# Patient Record
Sex: Male | Born: 1970
Health system: Southern US, Community
[De-identification: ages and names within clinical notes are randomized; demographics above are authoritative.]

---

## 2009-10-24 ENCOUNTER — Emergency Department: Payer: Self-pay | Admitting: Unknown Physician Specialty

## 2012-01-06 ENCOUNTER — Ambulatory Visit: Payer: Self-pay

## 2012-01-30 ENCOUNTER — Ambulatory Visit: Payer: Self-pay

## 2016-04-21 DIAGNOSIS — E1165 Type 2 diabetes mellitus with hyperglycemia: Secondary | ICD-10-CM | POA: Diagnosis not present

## 2016-04-21 DIAGNOSIS — E349 Endocrine disorder, unspecified: Secondary | ICD-10-CM | POA: Diagnosis not present

## 2016-04-22 DIAGNOSIS — E1122 Type 2 diabetes mellitus with diabetic chronic kidney disease: Secondary | ICD-10-CM | POA: Diagnosis not present

## 2016-04-22 DIAGNOSIS — E1165 Type 2 diabetes mellitus with hyperglycemia: Secondary | ICD-10-CM | POA: Diagnosis not present

## 2016-04-22 DIAGNOSIS — N183 Chronic kidney disease, stage 3 (moderate): Secondary | ICD-10-CM | POA: Diagnosis not present

## 2016-05-07 DIAGNOSIS — E349 Endocrine disorder, unspecified: Secondary | ICD-10-CM | POA: Diagnosis not present

## 2016-05-30 DIAGNOSIS — E291 Testicular hypofunction: Secondary | ICD-10-CM | POA: Diagnosis not present

## 2016-06-13 DIAGNOSIS — E349 Endocrine disorder, unspecified: Secondary | ICD-10-CM | POA: Diagnosis not present

## 2016-07-22 DIAGNOSIS — E1122 Type 2 diabetes mellitus with diabetic chronic kidney disease: Secondary | ICD-10-CM | POA: Diagnosis not present

## 2016-07-22 DIAGNOSIS — N183 Chronic kidney disease, stage 3 (moderate): Secondary | ICD-10-CM | POA: Diagnosis not present

## 2016-07-22 DIAGNOSIS — I10 Essential (primary) hypertension: Secondary | ICD-10-CM | POA: Diagnosis not present

## 2016-07-22 DIAGNOSIS — E1129 Type 2 diabetes mellitus with other diabetic kidney complication: Secondary | ICD-10-CM | POA: Diagnosis not present

## 2016-11-19 DIAGNOSIS — R7989 Other specified abnormal findings of blood chemistry: Secondary | ICD-10-CM | POA: Diagnosis not present

## 2016-11-19 DIAGNOSIS — R5381 Other malaise: Secondary | ICD-10-CM | POA: Diagnosis not present

## 2016-11-19 DIAGNOSIS — I10 Essential (primary) hypertension: Secondary | ICD-10-CM | POA: Diagnosis not present

## 2016-11-28 DIAGNOSIS — R7989 Other specified abnormal findings of blood chemistry: Secondary | ICD-10-CM | POA: Diagnosis not present

## 2016-12-09 DIAGNOSIS — M26629 Arthralgia of temporomandibular joint, unspecified side: Secondary | ICD-10-CM | POA: Diagnosis not present

## 2016-12-09 DIAGNOSIS — H93291 Other abnormal auditory perceptions, right ear: Secondary | ICD-10-CM | POA: Diagnosis not present

## 2017-01-14 DIAGNOSIS — Z23 Encounter for immunization: Secondary | ICD-10-CM | POA: Diagnosis not present

## 2017-03-18 DIAGNOSIS — E1129 Type 2 diabetes mellitus with other diabetic kidney complication: Secondary | ICD-10-CM | POA: Diagnosis not present

## 2017-03-18 DIAGNOSIS — N183 Chronic kidney disease, stage 3 (moderate): Secondary | ICD-10-CM | POA: Diagnosis not present

## 2017-03-18 DIAGNOSIS — E1122 Type 2 diabetes mellitus with diabetic chronic kidney disease: Secondary | ICD-10-CM | POA: Diagnosis not present

## 2017-12-02 DIAGNOSIS — E1129 Type 2 diabetes mellitus with other diabetic kidney complication: Secondary | ICD-10-CM | POA: Diagnosis not present

## 2017-12-02 DIAGNOSIS — N183 Chronic kidney disease, stage 3 (moderate): Secondary | ICD-10-CM | POA: Diagnosis not present

## 2017-12-02 DIAGNOSIS — E1142 Type 2 diabetes mellitus with diabetic polyneuropathy: Secondary | ICD-10-CM | POA: Diagnosis not present

## 2017-12-02 DIAGNOSIS — E1122 Type 2 diabetes mellitus with diabetic chronic kidney disease: Secondary | ICD-10-CM | POA: Diagnosis not present

## 2018-01-28 DIAGNOSIS — E119 Type 2 diabetes mellitus without complications: Secondary | ICD-10-CM | POA: Diagnosis not present

## 2018-01-28 DIAGNOSIS — R7989 Other specified abnormal findings of blood chemistry: Secondary | ICD-10-CM | POA: Diagnosis not present

## 2018-01-28 DIAGNOSIS — R5381 Other malaise: Secondary | ICD-10-CM | POA: Diagnosis not present

## 2018-03-26 DIAGNOSIS — Z79899 Other long term (current) drug therapy: Secondary | ICD-10-CM | POA: Diagnosis not present

## 2018-03-26 DIAGNOSIS — E291 Testicular hypofunction: Secondary | ICD-10-CM | POA: Diagnosis not present

## 2018-03-26 DIAGNOSIS — N401 Enlarged prostate with lower urinary tract symptoms: Secondary | ICD-10-CM | POA: Diagnosis not present

## 2018-06-08 DIAGNOSIS — E291 Testicular hypofunction: Secondary | ICD-10-CM | POA: Diagnosis not present

## 2018-07-12 DIAGNOSIS — E1142 Type 2 diabetes mellitus with diabetic polyneuropathy: Secondary | ICD-10-CM | POA: Diagnosis not present

## 2018-07-12 DIAGNOSIS — E1122 Type 2 diabetes mellitus with diabetic chronic kidney disease: Secondary | ICD-10-CM | POA: Diagnosis not present

## 2018-07-12 DIAGNOSIS — E1129 Type 2 diabetes mellitus with other diabetic kidney complication: Secondary | ICD-10-CM | POA: Diagnosis not present

## 2018-08-05 DIAGNOSIS — N183 Chronic kidney disease, stage 3 (moderate): Secondary | ICD-10-CM | POA: Diagnosis not present

## 2018-08-05 DIAGNOSIS — I10 Essential (primary) hypertension: Secondary | ICD-10-CM | POA: Diagnosis not present

## 2018-11-19 ENCOUNTER — Other Ambulatory Visit: Payer: Self-pay | Admitting: Nephrology

## 2018-11-19 DIAGNOSIS — N183 Chronic kidney disease, stage 3 unspecified: Secondary | ICD-10-CM

## 2018-11-30 ENCOUNTER — Other Ambulatory Visit: Payer: Self-pay

## 2018-11-30 ENCOUNTER — Ambulatory Visit
Admission: RE | Admit: 2018-11-30 | Discharge: 2018-11-30 | Disposition: A | Discharge status: Home / Self Care | Payer: No Typology Code available for payment source | Source: Ambulatory Visit | Attending: Nephrology | Admitting: Nephrology

## 2018-11-30 DIAGNOSIS — N183 Chronic kidney disease, stage 3 unspecified: Secondary | ICD-10-CM

## 2019-11-18 ENCOUNTER — Other Ambulatory Visit: Payer: Self-pay

## 2019-11-18 ENCOUNTER — Other Ambulatory Visit: Payer: Self-pay | Admitting: Nephrology

## 2019-11-18 ENCOUNTER — Ambulatory Visit
Admission: RE | Admit: 2019-11-18 | Discharge: 2019-11-18 | Disposition: A | Discharge status: Home / Self Care | Payer: No Typology Code available for payment source | Source: Ambulatory Visit | Attending: Nephrology | Admitting: Nephrology

## 2019-11-18 DIAGNOSIS — N200 Calculus of kidney: Secondary | ICD-10-CM | POA: Diagnosis present

## 2021-05-06 IMAGING — US US RENAL
1 series · 14 of 25 positions shown · non-contrast
Comparison: None.

CLINICAL DATA: Chronic kidney disease stage 3.

EXAM:
RENAL / URINARY TRACT ULTRASOUND COMPLETE

[Series 1: us renal · 0.26mm/px · 14 of 33 slices shown]
[im 1/33]
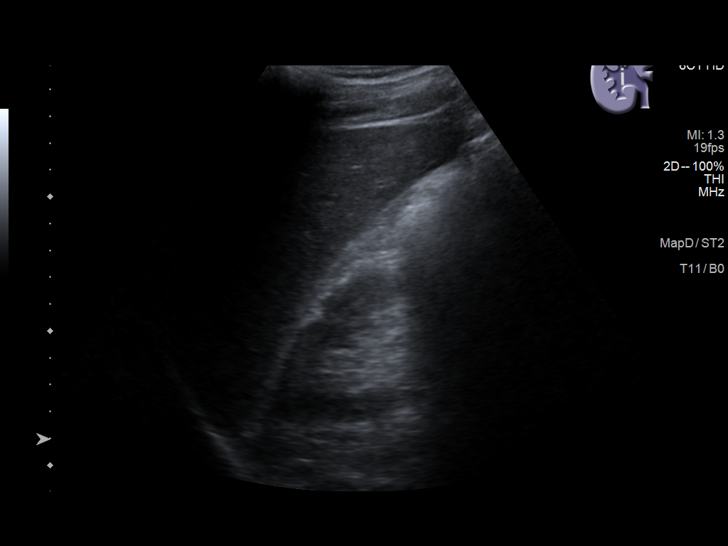
[im 3/33]
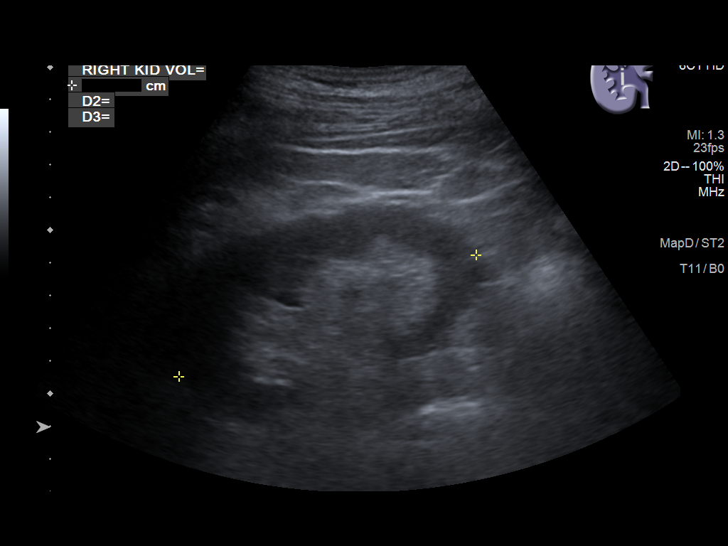
[im 6/33]
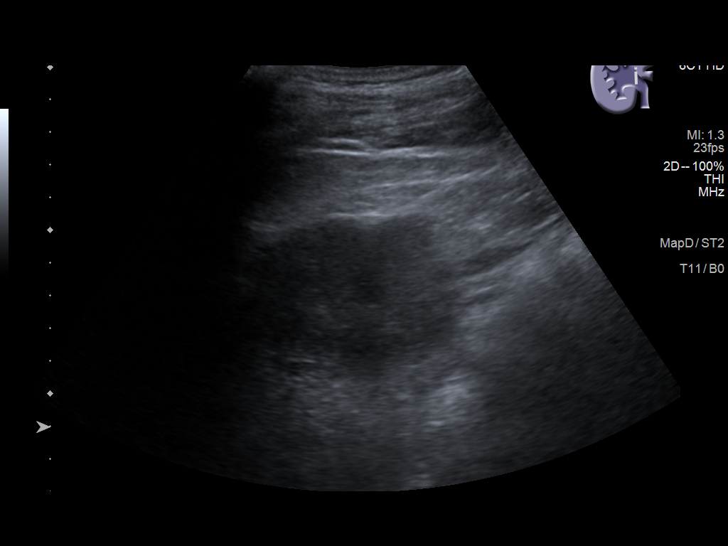
[im 9/33]
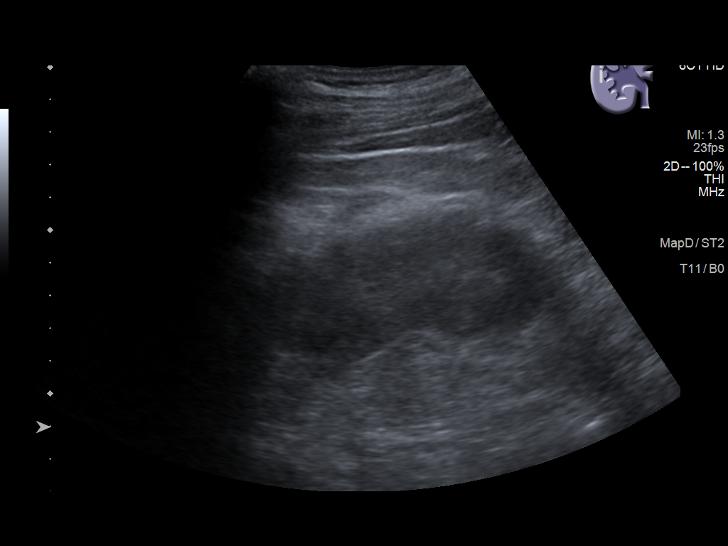
[im 11/33]
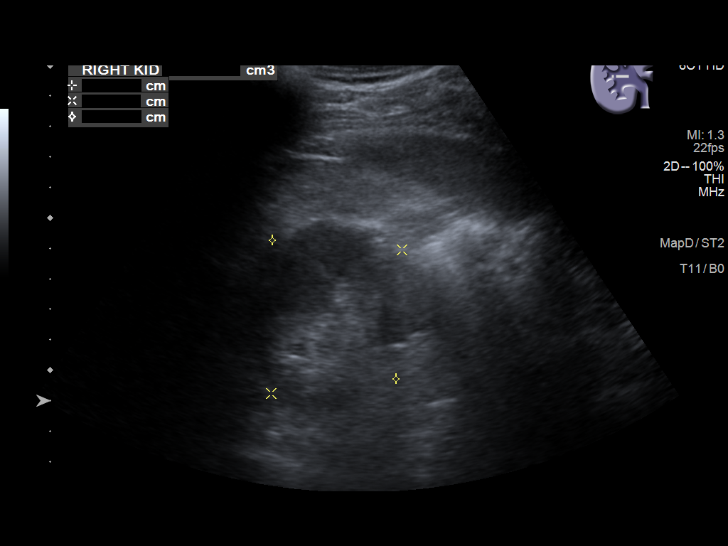
[im 13/33]
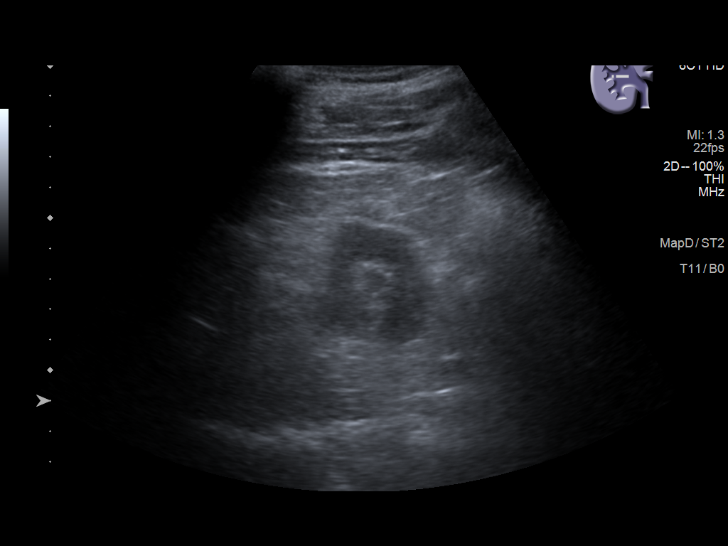
[im 15/33]
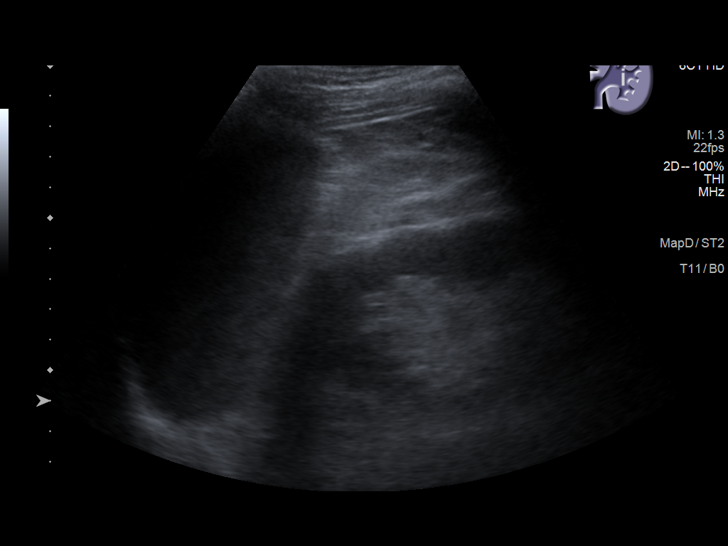
[im 18/33]
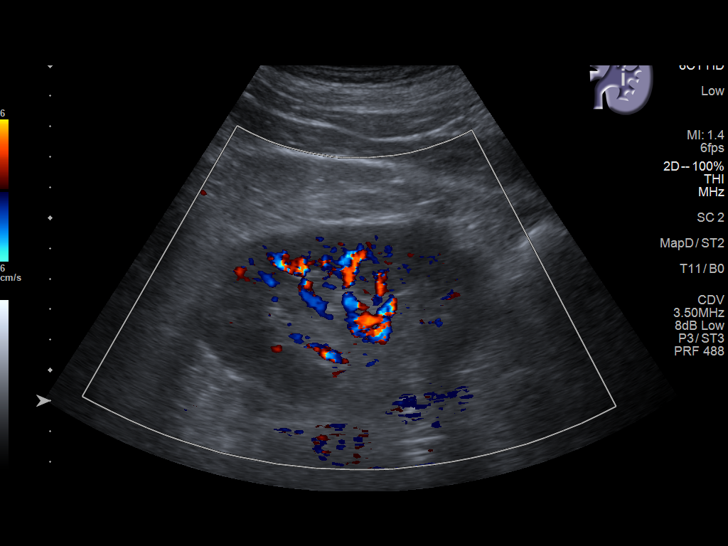
[im 21/33]
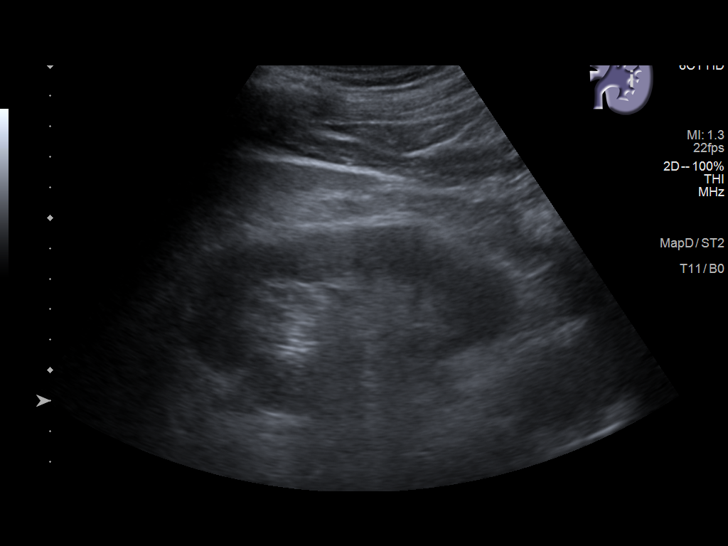
[im 22/33]
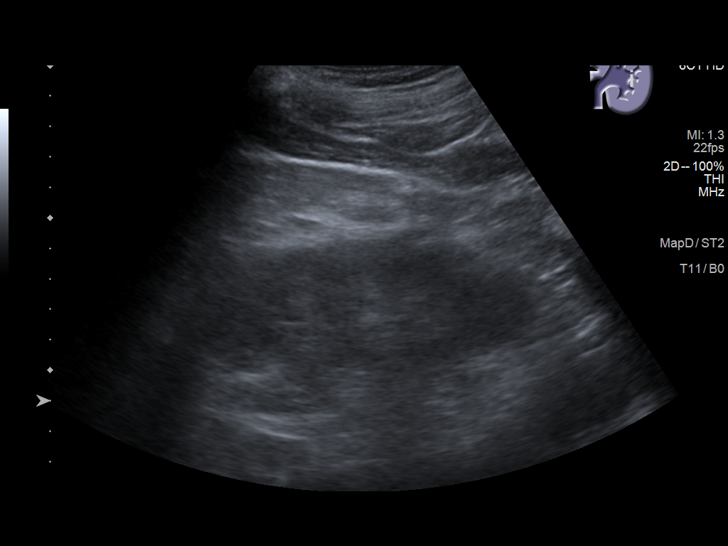
[im 25/33]
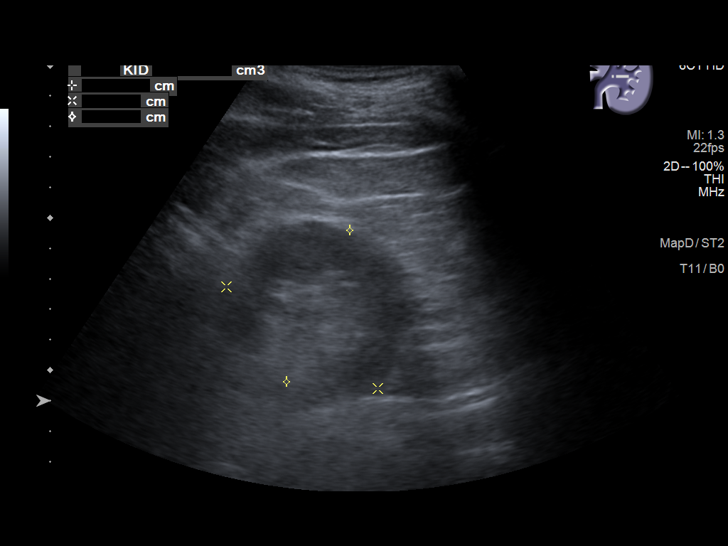
[im 27/33]
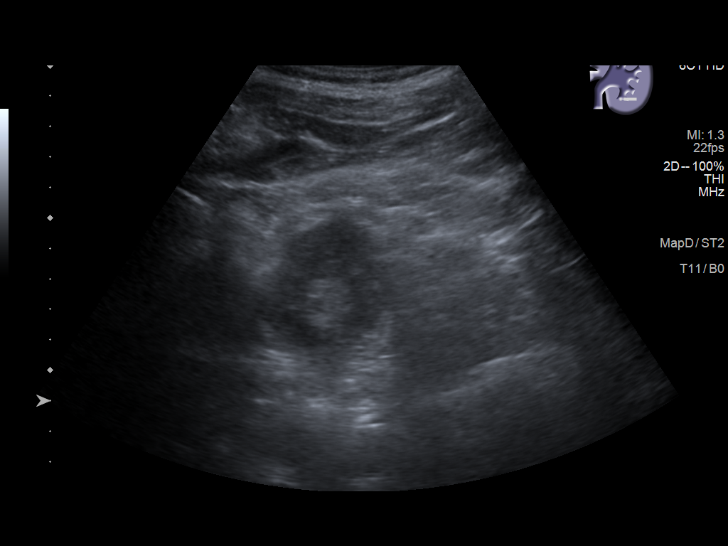
[im 30/33]
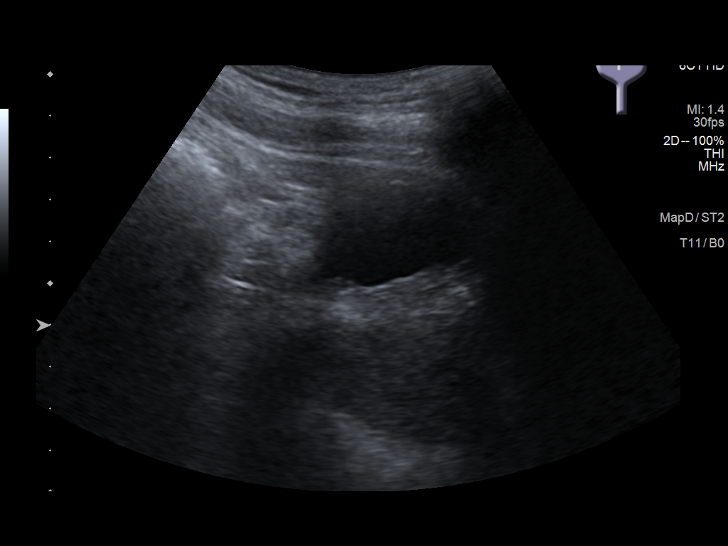
[im 33/33]
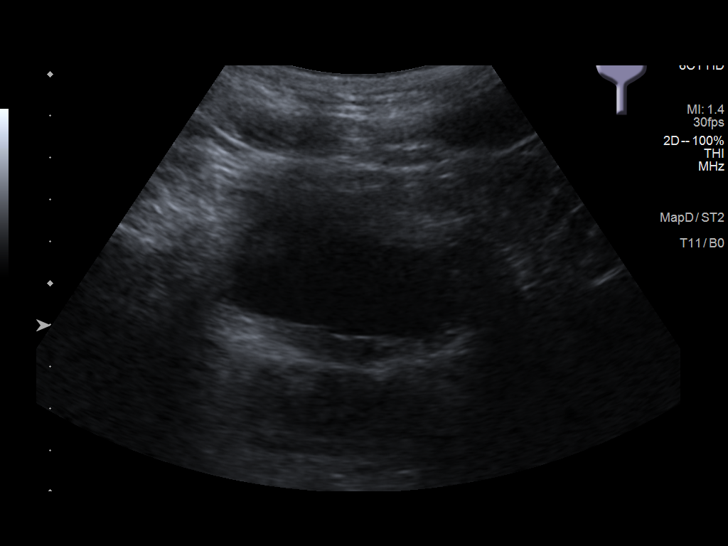

[14 of 25 positions shown; findings below may reference images not displayed]

FINDINGS: Right Kidney:

Renal measurements: 9.8 x 6.4 x 6.1 cm = volume: 200 mL .
Echogenicity within normal limits. No mass or hydronephrosis
visualized.

Left Kidney:

Renal measurements: 10.7 x 6.0 x 5.4 cm = volume: 180 mL.
Echogenicity within normal limits. No mass or hydronephrosis
visualized.

Bladder:

Appears normal for degree of bladder distention.
IMPRESSION: Normal renal ultrasound.

## 2022-04-24 IMAGING — CT CT ABD-PELV W/O CM
2 of 4 series · 15 of 46 positions shown, 17 images · non-contrast
Comparison: None.

CLINICAL DATA: Right flank pain and hematuria for 2 days.

EXAM:
CT ABDOMEN AND PELVIS WITHOUT CONTRAST
TECHNIQUE: Multidetector CT imaging of the abdomen and pelvis was performed
following the standard protocol without IV contrast.

[Series 2: stone full standard · axial · 0.72mm/px · z∈[-1169,-669]mm · 12 of 110 slices shown, 14 images]
[im 5/110  soft-tissue]
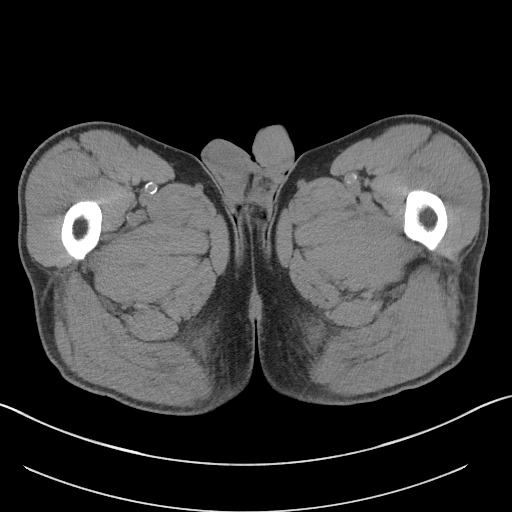
[im 5/110  bone]
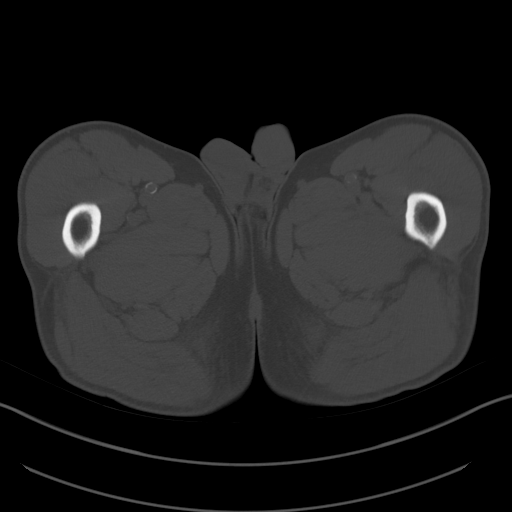
[im 14/110  soft-tissue]
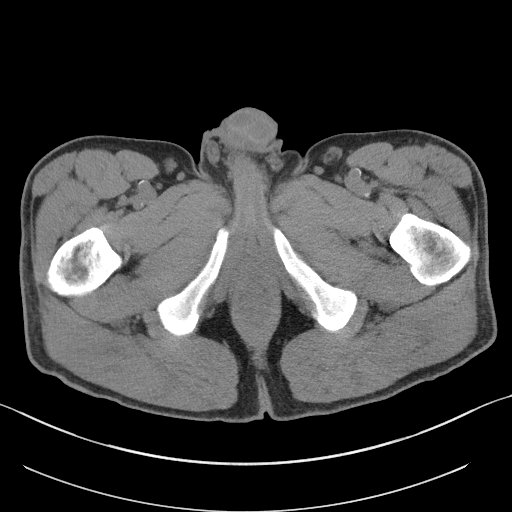
[im 23/110  soft-tissue]
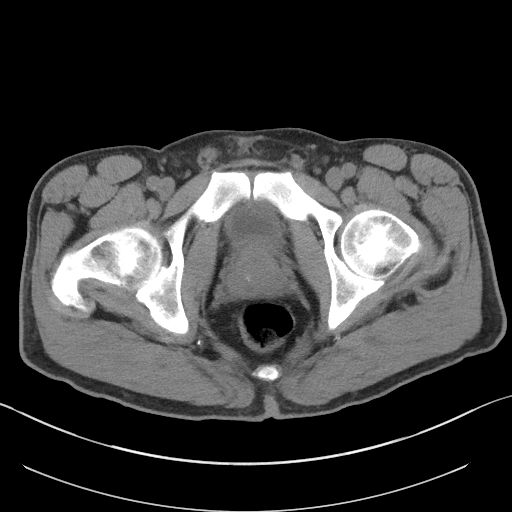
[im 32/110  soft-tissue]
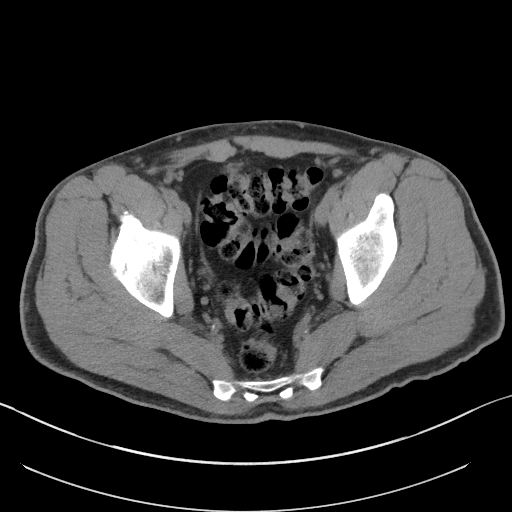
[im 41/110  soft-tissue]
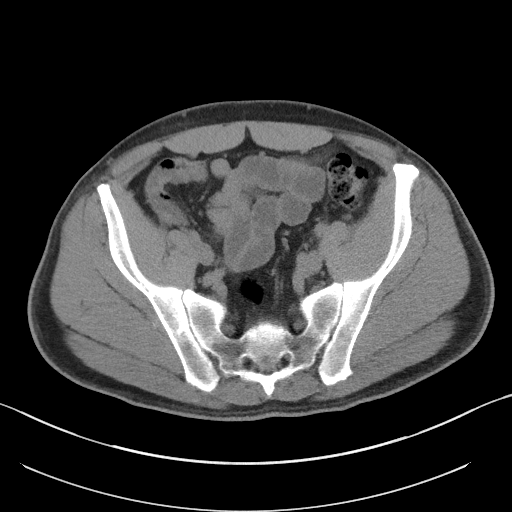
[im 50/110  soft-tissue]
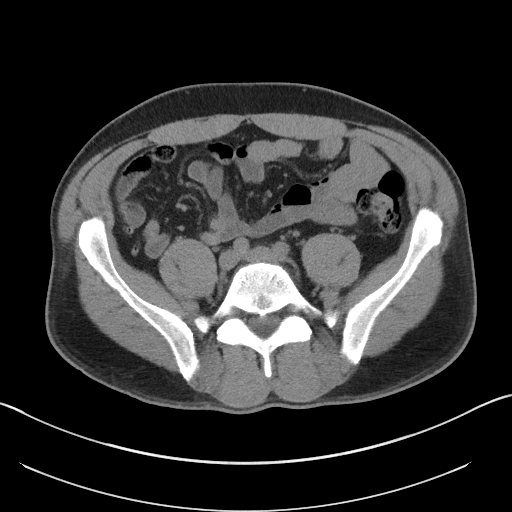
[im 60/110  soft-tissue]
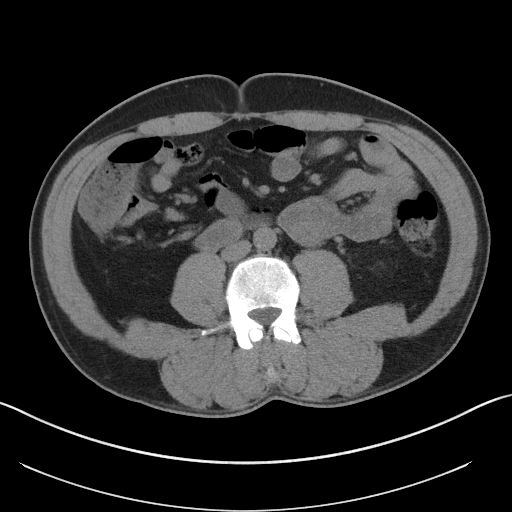
[im 69/110  soft-tissue]
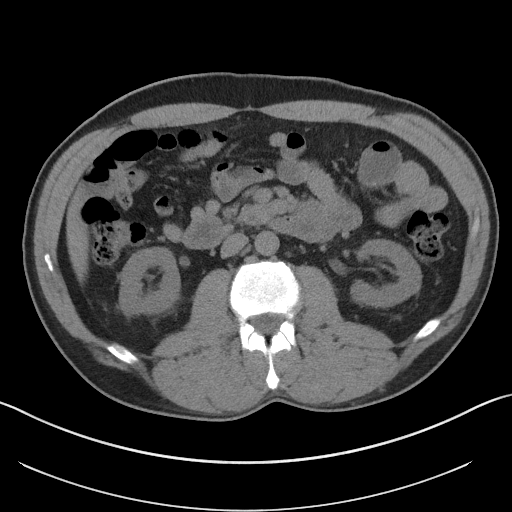
[im 78/110  soft-tissue]
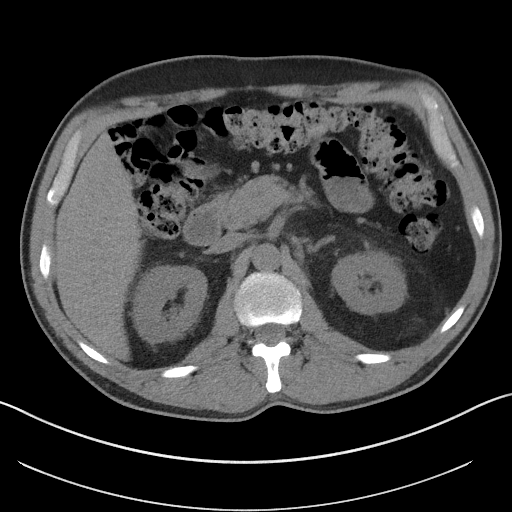
[im 78/110  bone]
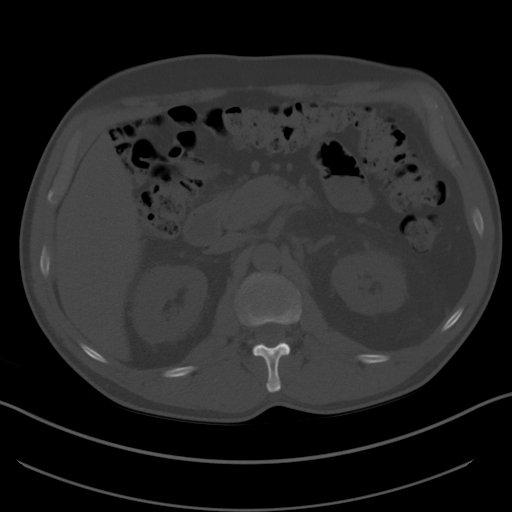
[im 87/110  soft-tissue]
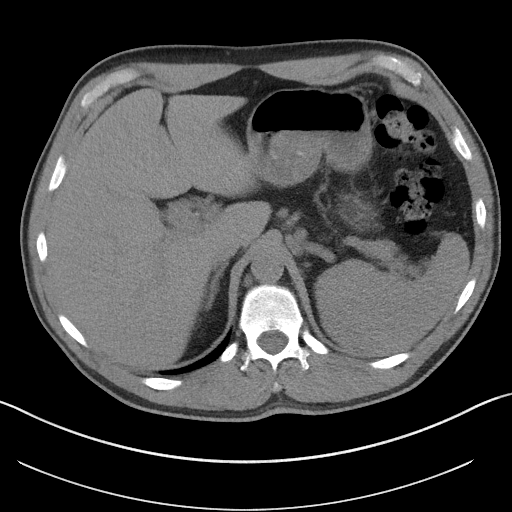
[im 96/110  soft-tissue]
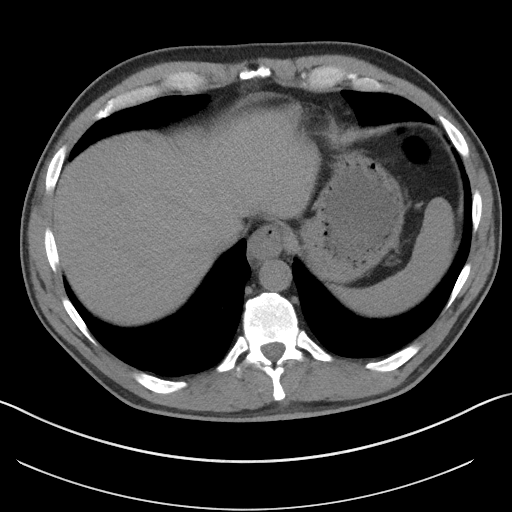
[im 105/110  soft-tissue]
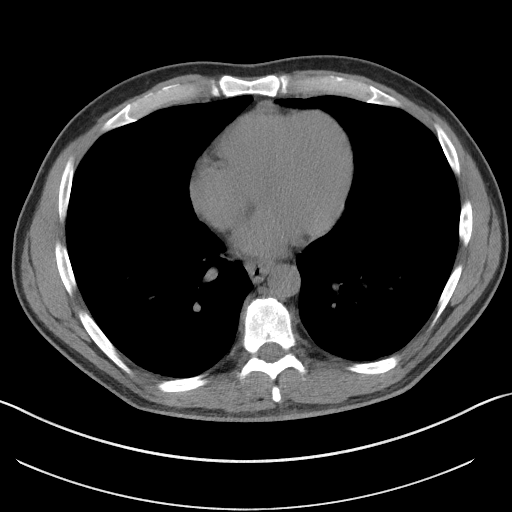

[Series 5: coronal · coronal · 0.67mm/px · 3 of 140 slices shown]
[im 47/140  soft-tissue]
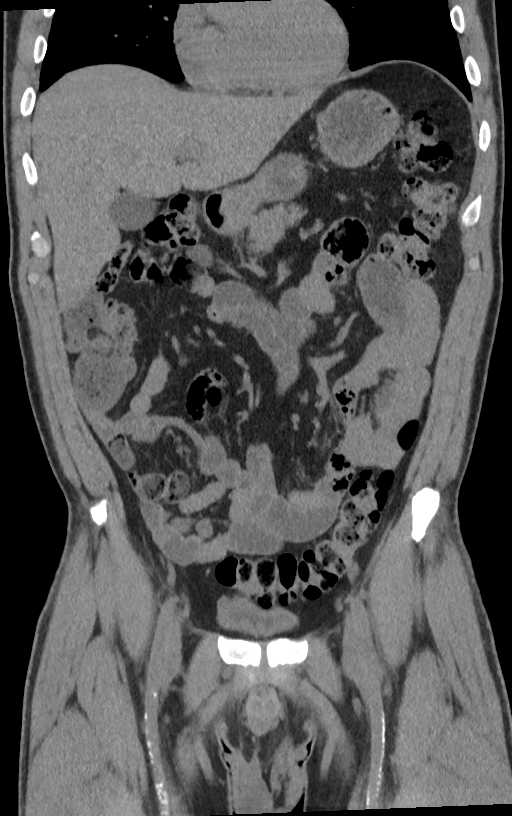
[im 62/140  soft-tissue]
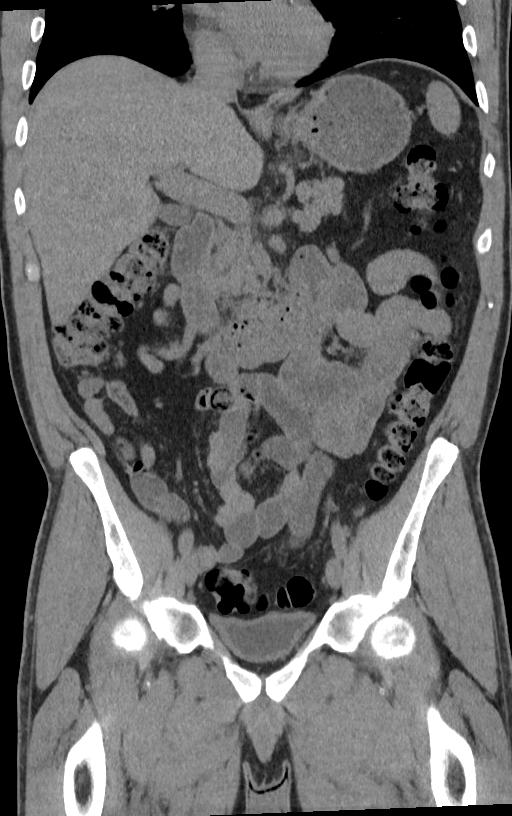
[im 78/140  soft-tissue]
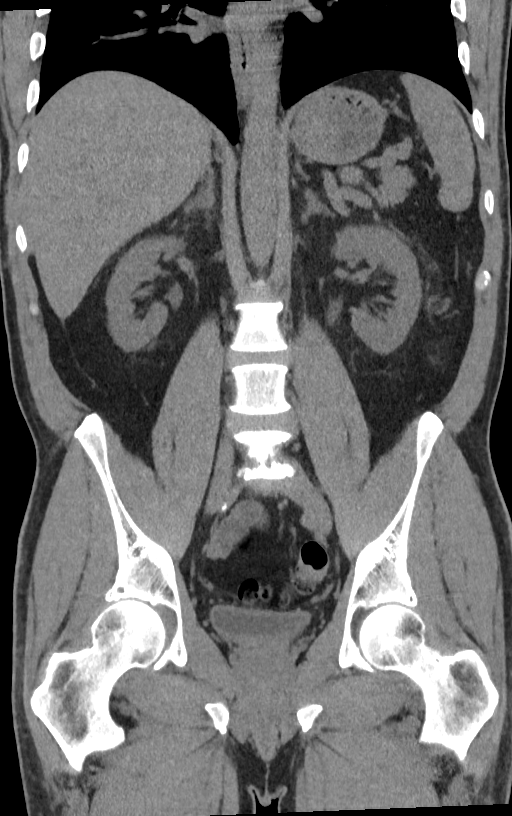

[15 of 46 positions shown; findings below may reference images not displayed]

FINDINGS: Lower chest: The lung bases are clear of acute process. No pleural
effusion or pulmonary lesions. The heart is normal in size. No
pericardial effusion. The distal esophagus and aorta are
unremarkable.

Hepatobiliary: No hepatic lesions are identified without contrast.
No intrahepatic biliary dilatation. The gallbladder appears normal.
No common bile duct dilatation.

Pancreas: No mass, inflammation or ductal dilatation.

Spleen: Normal size.  No focal lesions.

Adrenals/Urinary Tract: The adrenal glands and kidneys are
unremarkable. No renal, ureteral or bladder calculi or mass is
identified without contrast.

Stomach/Bowel: The stomach, duodenum, small bowel and colon are
grossly normal without oral contrast. No inflammatory changes, mass
lesions or obstructive findings. The appendix is normal.

Vascular/Lymphatic: The aorta is normal in caliber. Minimal
scattered atheroscerlotic calcifications. More significant
calcifications are noted in the femoral arteries bilaterally. No
mesenteric of retroperitoneal mass or adenopathy. Small scattered
lymph nodes are noted.

Reproductive: The prostate gland and seminal vesicles are
unremarkable.

Other: No pelvic mass or adenopathy. No free pelvic fluid
collections. No inguinal mass or adenopathy. No abdominal wall
hernia or subcutaneous lesions.

Musculoskeletal: No significant bony findings. Advanced degenerative
disc disease noted at L5-S1.
IMPRESSION: 1. No acute abdominal/pelvic findings, mass lesions or adenopathy.
2. No renal, ureteral or bladder calculi or mass.
3. Age advanced vascular calcifications.

## 2022-06-26 ENCOUNTER — Encounter: Payer: Self-pay | Admitting: Urology

## 2022-06-26 ENCOUNTER — Ambulatory Visit (INDEPENDENT_AMBULATORY_CARE_PROVIDER_SITE_OTHER): Payer: 59 | Admitting: Urology

## 2022-06-26 VITALS — BP 124/85 | HR 92 | Ht 72.0 in | Wt 184.8 lb

## 2022-06-26 DIAGNOSIS — E291 Testicular hypofunction: Secondary | ICD-10-CM

## 2022-06-26 DIAGNOSIS — Z125 Encounter for screening for malignant neoplasm of prostate: Secondary | ICD-10-CM | POA: Diagnosis not present

## 2022-06-26 DIAGNOSIS — N529 Male erectile dysfunction, unspecified: Secondary | ICD-10-CM

## 2022-06-26 DIAGNOSIS — R7989 Other specified abnormal findings of blood chemistry: Secondary | ICD-10-CM

## 2022-06-26 MED ORDER — TADALAFIL 5 MG PO TABS: 5.0000 mg | ORAL_TABLET | Freq: Every day | ORAL | 3 refills | Status: DC

## 2022-06-26 MED ORDER — XYOSTED 75 MG/0.5ML ~~LOC~~ SOAJ: 75.0000 mg | SUBCUTANEOUS | 3 refills | Status: DC

## 2022-06-26 NOTE — Progress Notes (Signed)
   I, DeAsia L Maxie,acting as a scribe for Abbie Sons, MD.,have documented all relevant documentation on the behalf of Abbie Sons, MD,as directed by  Abbie Sons, MD while in the presence of Abbie Sons, MD.   I, Amy L Pierron,acting as a scribe for Abbie Sons, MD.,have documented all relevant documentation on the behalf of Abbie Sons, MD,as directed by  Abbie Sons, MD while in the presence of Abbie Sons, MD.  06/26/2022 4:38 PM   Manson Allan 08-06-1970 [id number]  Referring provider: Maryland Pink, MD [address] [facility] [place],  [place] [address]  Chief Complaint  Patient presents with   Other    testosterone    HPI: Joshua Peck is a 52 y.o. male who presents today to transfer urologic care with the retirement of his recent urologist.  Previously followed by Dr. Yves Dill for hypogonadism and erectile dysfunction and was last seen on 05/15/2021. He is on compounded testosterone cream 20% 1 cc daily. Prior to that was using [place]. He is also on a PDE-5 inhibitor (tadalafil 5 mg) for erectile dysfunction.  He continues on compounded testosterone cream but stated it is inconvenient waiting to get the medication delivered form [place].  He was previously on [place] and is interested in seeing if his insurance will cover.  No bothersome lower urinary tract symptoms. No dysuria or gross hematuria. No flank, abdominal, or pelvic pain.   Home Medications:  Allergies as of 06/26/2022   No Known Allergies      Medication List        Accurate as of June 26, 2022  4:38 PM. If you have any questions, ask your nurse or doctor.          EC-RX Testosterone 0.2 % Crea Place onto the skin.   gabapentin 300 MG capsule Commonly known as: NEURONTIN Take by mouth.   lisinopril-hydrochlorothiazide 20-12.5 MG tablet Commonly known as: ZESTORETIC Take 2 tablets by mouth daily.   omeprazole 40 MG capsule Commonly known as:  PRILOSEC   tadalafil 5 MG tablet Commonly known as: CIALIS Take 1 tablet (5 mg total) by mouth daily.   venlafaxine XR 37.5 MG 24 hr capsule Commonly known as: EFFEXOR-XR Take 37.5 mg by mouth daily.        Social History:  has no history on file for tobacco use, alcohol use, and drug use.  Physical Exam: BP 124/85   Pulse 92   Ht 6' (1.829 m)   Wt 184 lb 12.8 oz (83.8 kg)   BMI 25.06 kg/m   Constitutional:  Alert and oriented, No acute distress. HEENT: [place] AT Respiratory: Normal respiratory effort, no increased work of breathing. Psychiatric: Normal mood and affect.  Assessment & Plan:    Hypogonadism Presently on testosterone cream; testosterone, PSA, and hematocrit were drawn today. Rx for [facility] sent to [employer] [facility] to see if insurance coverage has changed.  If not covered we discussed the availability of generic testosterone gel using [employer] discount which is inexpensive.   2. Erectile dysfunction Rx for Tadalafil 5 mg sent to pharmacy.  I have reviewed the above documentation for accuracy and completeness, and I agree with the above.   Abbie Sons, [place] [address] [place], [place] [address] [phone]

## 2022-06-27 LAB — PSA: Prostate Specific Ag, Serum: 1.3 ng/mL (ref 0.0–4.0)

## 2022-06-27 LAB — HEMATOCRIT: Hematocrit: 44.3 % (ref 37.5–51.0)

## 2022-06-27 LAB — TESTOSTERONE: Testosterone: 651 ng/dL (ref 264–916)

## 2022-06-30 ENCOUNTER — Telehealth: Payer: Self-pay | Admitting: *Deleted

## 2022-06-30 NOTE — Telephone Encounter (Signed)
Notified patient as instructed, patient pleased °

## 2022-06-30 NOTE — Telephone Encounter (Signed)
-----   Message from Abbie Sons, MD sent at 06/30/2022  7:25 AM EDT ----- Testosterone level looks good at 651.  PSA 1.3, hematocrit normal 44.3.  Keep follow-up appointments as scheduled

## 2022-07-07 ENCOUNTER — Telehealth: Payer: Self-pay | Admitting: Family Medicine

## 2022-07-07 NOTE — Telephone Encounter (Signed)
Patient notified Barbaraann Cao is excluded from the insurance coverage. Patient would like to know what else he could try?

## 2022-07-08 NOTE — Telephone Encounter (Signed)
Options would be testosterone gel and intramuscular injections

## 2022-07-08 NOTE — Telephone Encounter (Signed)
Patient would like to do the injections. He wants to inject himself. He will need needles and syringes. He will cal our office when he gets the medication to set up an appointment for injection training.

## 2022-07-09 MED ORDER — TESTOSTERONE CYPIONATE 200 MG/ML IM SOLN: 200.0000 mg | INTRAMUSCULAR | 0 refills | Status: DC

## 2022-07-09 MED ORDER — "LUER LOCK SAFETY SYRINGES 21G X 1-1/2"" 3 ML MISC": 0 refills | Status: AC

## 2022-07-09 NOTE — Addendum Note (Signed)
Addended by: Riki Altes on: 07/09/2022 12:34 PM   Modules accepted: Orders

## 2022-12-18 ENCOUNTER — Telehealth: Payer: Self-pay | Admitting: *Deleted

## 2022-12-18 ENCOUNTER — Other Ambulatory Visit: Payer: Self-pay | Admitting: Urology

## 2022-12-18 ENCOUNTER — Other Ambulatory Visit: Payer: Self-pay | Admitting: *Deleted

## 2022-12-18 MED ORDER — SILDENAFIL CITRATE 20 MG PO TABS: ORAL_TABLET | ORAL | 3 refills | Status: DC

## 2022-12-18 NOTE — Telephone Encounter (Signed)
On review of Dr. Amada Jupiter records he was taking the 20 mg dose.  Can send in Rx [facility] sildenafil 20 mg 1-2 tabs 1 hour prior to intercourse #90/3 refills

## 2022-12-18 NOTE — Telephone Encounter (Signed)
Pt calling asking if he can get a refill of Sildenafil, pt states that he has been taking this for 10 years and was getting it "somewhere else" and he can not longer get it from them and would like a refill. Pt also states that Dr. Artis Flock was giving his this before he retired. Please advise

## 2022-12-18 NOTE — Telephone Encounter (Signed)
Notified patient as instructed, patient pleased. Rx sent

## 2022-12-19 ENCOUNTER — Telehealth: Payer: Self-pay | Admitting: *Deleted

## 2022-12-19 DIAGNOSIS — R7989 Other specified abnormal findings of blood chemistry: Secondary | ICD-10-CM

## 2022-12-19 NOTE — Telephone Encounter (Signed)
Notified patient as instructed, patient pleased. Discussed follow-up appointments, patient agrees

## 2022-12-19 NOTE — Telephone Encounter (Signed)
-----   Message from Verna Czech [facility] sent at 12/19/2022  8:10 AM EDT ----- Regarding: Lab visit I had refill request for testosterone.  He is due for lab visit for testosterone and H/H.  I sent in a limited Rx.

## 2022-12-24 ENCOUNTER — Other Ambulatory Visit: Payer: 59

## 2022-12-24 DIAGNOSIS — R7989 Other specified abnormal findings of blood chemistry: Secondary | ICD-10-CM

## 2022-12-25 LAB — HEMOGLOBIN AND HEMATOCRIT, BLOOD
Hematocrit: 49.5 % (ref 37.5–51.0)
Hemoglobin: 15.9 g/dL (ref 13.0–17.7)

## 2022-12-25 LAB — TESTOSTERONE: Testosterone: 569 ng/dL (ref 264–916)

## 2023-02-03 ENCOUNTER — Other Ambulatory Visit: Payer: Self-pay | Admitting: Urology

## 2023-04-15 ENCOUNTER — Other Ambulatory Visit: Payer: Self-pay | Admitting: Urology

## 2023-06-23 ENCOUNTER — Other Ambulatory Visit: Payer: Self-pay | Admitting: *Deleted

## 2023-06-23 DIAGNOSIS — Z125 Encounter for screening for malignant neoplasm of prostate: Secondary | ICD-10-CM

## 2023-06-23 DIAGNOSIS — R7989 Other specified abnormal findings of blood chemistry: Secondary | ICD-10-CM

## 2023-06-24 ENCOUNTER — Encounter: Payer: Self-pay | Admitting: Urology

## 2023-06-24 ENCOUNTER — Other Ambulatory Visit: Payer: Self-pay

## 2023-06-26 ENCOUNTER — Ambulatory Visit: Payer: Self-pay | Admitting: Urology

## 2023-07-21 ENCOUNTER — Other Ambulatory Visit: Payer: Self-pay | Admitting: Urology

## 2023-07-22 ENCOUNTER — Telehealth: Payer: Self-pay | Admitting: Urology

## 2023-07-22 NOTE — Telephone Encounter (Signed)
 Patient is requesting refill for testosterone ;

## 2023-07-22 NOTE — Telephone Encounter (Signed)
 Refill was denied because patient needs appointment. Lab appt is scheduled for 07/23/23 and appointment with Sam is scheduled for 07/31/23. He is requesting refill be sent to [facility] pharmacy on [address]

## 2023-07-23 ENCOUNTER — Other Ambulatory Visit

## 2023-07-23 DIAGNOSIS — Z125 Encounter for screening for malignant neoplasm of prostate: Secondary | ICD-10-CM

## 2023-07-23 DIAGNOSIS — R7989 Other specified abnormal findings of blood chemistry: Secondary | ICD-10-CM

## 2023-07-23 MED ORDER — TESTOSTERONE CYPIONATE 200 MG/ML IM SOLN: 200.0000 mg | INTRAMUSCULAR | 0 refills | Status: DC

## 2023-07-24 LAB — HEMOGLOBIN AND HEMATOCRIT, BLOOD
Hematocrit: 51.9 % — ABNORMAL HIGH (ref 37.5–51.0)
Hemoglobin: 17.2 g/dL (ref 13.0–17.7)

## 2023-07-24 LAB — TESTOSTERONE: Testosterone: 113 ng/dL — ABNORMAL LOW (ref 264–916)

## 2023-07-24 LAB — PSA: Prostate Specific Ag, Serum: 1.4 ng/mL (ref 0.0–4.0)

## 2023-07-31 ENCOUNTER — Ambulatory Visit (INDEPENDENT_AMBULATORY_CARE_PROVIDER_SITE_OTHER): Payer: Self-pay | Admitting: Physician Assistant

## 2023-07-31 ENCOUNTER — Other Ambulatory Visit: Payer: Self-pay

## 2023-07-31 VITALS — BP 119/78 | HR 84

## 2023-07-31 DIAGNOSIS — N529 Male erectile dysfunction, unspecified: Secondary | ICD-10-CM | POA: Diagnosis not present

## 2023-07-31 DIAGNOSIS — R7989 Other specified abnormal findings of blood chemistry: Secondary | ICD-10-CM | POA: Diagnosis not present

## 2023-07-31 MED ORDER — TADALAFIL 5 MG PO TABS: 5.0000 mg | ORAL_TABLET | Freq: Every day | ORAL | 3 refills | Status: AC

## 2023-07-31 MED ORDER — TESTOSTERONE CYPIONATE 200 MG/ML IM SOLN: 200.0000 mg | INTRAMUSCULAR | 0 refills | Status: DC

## 2023-07-31 MED ORDER — SILDENAFIL CITRATE 20 MG PO TABS: ORAL_TABLET | ORAL | 3 refills | Status: AC

## 2023-07-31 NOTE — Progress Notes (Signed)
 07/31/2023 9:35 AM   Joshua Peck 13-Jan-1971 [id number]  CC: Chief Complaint  Patient presents with   Follow-up   HPI: Joshua Peck is a 53 y.o. male with PMH hypogonadism on testosterone  cypionate IM 200 mg every 14 days and ED on tadalafil  5 mg daily and sildenafil  20 mg demand dose who presents today for annual follow-up.   Today he reports he ran out of testosterone  over a month ago.  He takes it consistently every 2 weeks and does notice a little bit of a slump toward the end of his injection.,  But he would rather not increase his frequency of injections.  His ED is well-controlled on PDE 5 inhibitors and he notes that he tends to take 40 mg of sildenafil  for his demand dose.  No priapism or nitrates.  Recent labs notable for stable PSA, 1.4, slightly increased H&H yet still within range, and subtherapeutic testosterone .  PMH: No past medical history on file.  Surgical History: No past surgical history on file.  Home Medications:  Allergies as of 07/31/2023   No Known Allergies      Medication List        Accurate as of Jul 31, 2023  9:35 AM. If you have any questions, ask your nurse or doctor.          amoxicillin-clavulanate 875-125 MG tablet Commonly known as: AUGMENTIN Take 1 tablet by mouth 2 (two) times daily.   buPROPion 150 MG 24 hr tablet Commonly known as: WELLBUTRIN XL Take 150 mg by mouth daily.   escitalopram 10 MG tablet Commonly known as: LEXAPRO Take 10 mg by mouth daily.   gabapentin 300 MG capsule Commonly known as: NEURONTIN Take by mouth.   Jardiance 25 MG Tabs tablet Generic drug: empagliflozin Take 25 mg by mouth daily.   lisinopril-hydrochlorothiazide 20-12.5 MG tablet Commonly known as: ZESTORETIC Take 2 tablets by mouth daily.   Luer Lock Safety Syringes 21G X 1-1/2" 3 ML Misc Generic drug: SYRINGE-NEEDLE (DISP) 3 ML Use as directed for testosterone  administration   omeprazole 40 MG capsule Commonly known as:  PRILOSEC   Semaglutide (1 MG/DOSE) 4 MG/3ML Sopn Inject 1 mg into the skin.   sildenafil  20 MG tablet Commonly known as: REVATIO  1-2 tabs 1 hour prior to intercourse   tadalafil  5 MG tablet Commonly known as: CIALIS  Take 1 tablet (5 mg total) by mouth daily.   testosterone  cypionate 200 MG/ML injection Commonly known as: DEPOTESTOSTERONE CYPIONATE Inject 1 mL (200 mg total) into the muscle every 14 (fourteen) days. INJECT 1 ML INTRAMUSCULARLY EVERY 2 WEEKS   venlafaxine XR 37.5 MG 24 hr capsule Commonly known as: EFFEXOR-XR Take 37.5 mg by mouth daily.        Allergies:  No Known Allergies  Family History: No family history on file.  Social History:   has no history on file for tobacco use, alcohol use, and drug use.  Physical Exam: BP 119/78   Pulse 84   Constitutional:  Alert and oriented, no acute distress, nontoxic appearing HEENT: [place], AT Cardiovascular: No clubbing, cyanosis, or edema Respiratory: Normal respiratory effort, no increased work of breathing Skin: No rashes, bruises or suspicious lesions Neurologic: Grossly intact, no focal deficits, moving all 4 extremities Psychiatric: Normal mood and affect  Laboratory Data: Results for orders placed or performed in visit on 07/23/23  Testosterone    Collection Time: 07/23/23  9:38 AM  Result Value Ref Range   Testosterone  113 (L) 264 - 916 ng/dL  PSA   Collection Time: 07/23/23  9:38 AM  Result Value Ref Range   Prostate Specific Ag, Serum 1.4 0.0 - 4.0 ng/mL  Hemoglobin and Hematocrit, Blood   Collection Time: 07/23/23  9:38 AM  Result Value Ref Range   Hemoglobin 17.2 13.0 - 17.7 g/dL   Hematocrit 81.1 (H) 91.4 - 51.0 %   Assessment & Plan:   1. Low testosterone  (Primary) Symptoms well-controlled on current dose, recent level subtherapeutic off therapy for about a month.  Will resume 200 mg every 14 days, but if he starts to feel more slumpy in the future may consider 100 mg every 7 days to keep  tighter control on his levels. - testosterone  cypionate (DEPOTESTOSTERONE CYPIONATE) 200 MG/ML injection; Inject 1 mL (200 mg total) into the muscle every 14 (fourteen) days.  Dispense: 4 mL; Refill: 0  2. Erectile dysfunction, unspecified erectile dysfunction type Continue low-dose daily tadalafil  and demand sildenafil . - tadalafil  (CIALIS ) 5 MG tablet; Take 1 tablet (5 mg total) by mouth daily.  Dispense: 90 tablet; Refill: 3 - sildenafil  (REVATIO ) 20 MG tablet; 1-2 tabs 1 hour prior to intercourse  Dispense: 90 tablet; Refill: 3   Return in about 3 months (around 10/31/2023) for Lab visit for testosterone , PSA, H&H + 1 year f/u with SHIM.  Joshua Pare, PA-C  [facility] [place] [address], [address] [place], [place] [address] [phone]

## 2023-08-19 ENCOUNTER — Other Ambulatory Visit: Payer: Self-pay

## 2023-08-20 ENCOUNTER — Other Ambulatory Visit: Payer: Self-pay

## 2023-08-21 ENCOUNTER — Ambulatory Visit: Payer: Self-pay | Admitting: Urology

## 2023-10-30 ENCOUNTER — Other Ambulatory Visit

## 2023-11-09 ENCOUNTER — Other Ambulatory Visit: Payer: Self-pay | Admitting: Physician Assistant

## 2023-11-09 DIAGNOSIS — R7989 Other specified abnormal findings of blood chemistry: Secondary | ICD-10-CM

## 2024-01-05 ENCOUNTER — Other Ambulatory Visit: Payer: Self-pay | Admitting: Urology

## 2024-01-05 ENCOUNTER — Telehealth: Payer: Self-pay | Admitting: Physician Assistant

## 2024-01-05 DIAGNOSIS — R7989 Other specified abnormal findings of blood chemistry: Secondary | ICD-10-CM

## 2024-01-05 NOTE — Telephone Encounter (Signed)
 Pt called and is very upset about not getting his refills at [facility].  He has been out of testosterone  and Tadalafil .

## 2024-01-06 NOTE — Telephone Encounter (Signed)
 Spoke with patient and advised him that enough refills were sent into the pharmacy for Tadalafil  and Testosterone  that last for 1 year. Advised patient to just call the pharmacy and request it to be refilled. Patient very appreciative and voiced understanding.

## 2024-01-06 NOTE — Telephone Encounter (Signed)
 I sent in a 1 year prescription for tadalafil  back in May.  There should be refills remaining and he just needs to ask for them.  I see that we sent in his testosterone  yesterday.

## 2024-02-15 ENCOUNTER — Other Ambulatory Visit

## 2024-02-16 ENCOUNTER — Encounter: Payer: Self-pay | Admitting: Urology

## 2024-07-29 ENCOUNTER — Ambulatory Visit: Admitting: Physician Assistant
